# Patient Record
Sex: Female | Born: 1960 | Race: White | Hispanic: No | Marital: Married | Smoking: Former smoker
Health system: Southern US, Community
[De-identification: ages and names within clinical notes are randomized; demographics above are authoritative.]

## PROBLEM LIST (undated history)

## (undated) DIAGNOSIS — N939 Abnormal uterine and vaginal bleeding, unspecified: Secondary | ICD-10-CM

## (undated) DIAGNOSIS — I10 Essential (primary) hypertension: Secondary | ICD-10-CM

## (undated) DIAGNOSIS — J45909 Unspecified asthma, uncomplicated: Secondary | ICD-10-CM

## (undated) DIAGNOSIS — K219 Gastro-esophageal reflux disease without esophagitis: Secondary | ICD-10-CM

## (undated) HISTORY — DX: Gastro-esophageal reflux disease without esophagitis: K21.9

## (undated) HISTORY — PX: ECTOPIC PREGNANCY SURGERY: SHX613

## (undated) HISTORY — DX: Abnormal uterine and vaginal bleeding, unspecified: N93.9

---

## 1993-04-09 HISTORY — PX: LEEP: SHX91

## 2004-06-15 ENCOUNTER — Ambulatory Visit: Payer: Self-pay | Admitting: Family Medicine

## 2004-06-19 ENCOUNTER — Ambulatory Visit: Payer: Self-pay | Admitting: General Surgery

## 2005-02-13 ENCOUNTER — Emergency Department: Payer: Self-pay | Admitting: Emergency Medicine

## 2005-02-16 ENCOUNTER — Ambulatory Visit: Payer: Self-pay | Admitting: Unknown Physician Specialty

## 2005-02-22 ENCOUNTER — Inpatient Hospital Stay (HOSPITAL_COMMUNITY): Admission: AD | Admit: 2005-02-22 | Discharge: 2005-02-22 | Payer: Self-pay | Admitting: Obstetrics and Gynecology

## 2005-08-01 ENCOUNTER — Ambulatory Visit: Payer: Self-pay | Admitting: Internal Medicine

## 2005-08-23 ENCOUNTER — Ambulatory Visit: Payer: Self-pay | Admitting: Unknown Physician Specialty

## 2006-07-12 ENCOUNTER — Other Ambulatory Visit: Payer: Self-pay

## 2006-07-12 ENCOUNTER — Emergency Department: Payer: Self-pay | Admitting: Emergency Medicine

## 2007-06-17 ENCOUNTER — Encounter: Admission: RE | Admit: 2007-06-17 | Discharge: 2007-06-17 | Payer: Self-pay | Admitting: *Deleted

## 2007-06-19 ENCOUNTER — Ambulatory Visit (HOSPITAL_COMMUNITY): Admission: RE | Admit: 2007-06-19 | Discharge: 2007-06-19 | Payer: Self-pay | Admitting: *Deleted

## 2007-06-27 ENCOUNTER — Encounter: Admission: RE | Admit: 2007-06-27 | Discharge: 2007-06-27 | Payer: Self-pay | Admitting: *Deleted

## 2007-09-16 ENCOUNTER — Ambulatory Visit: Payer: Self-pay | Admitting: Obstetrics and Gynecology

## 2008-09-16 ENCOUNTER — Ambulatory Visit: Payer: Self-pay | Admitting: Obstetrics and Gynecology

## 2008-11-30 ENCOUNTER — Ambulatory Visit: Payer: Self-pay | Admitting: Family Medicine

## 2009-03-11 ENCOUNTER — Emergency Department: Payer: Self-pay | Admitting: Emergency Medicine

## 2009-03-14 ENCOUNTER — Inpatient Hospital Stay: Payer: Self-pay | Admitting: Unknown Physician Specialty

## 2009-03-16 ENCOUNTER — Inpatient Hospital Stay: Payer: Self-pay | Admitting: Internal Medicine

## 2009-09-20 ENCOUNTER — Ambulatory Visit: Payer: Self-pay | Admitting: Obstetrics and Gynecology

## 2010-01-18 ENCOUNTER — Emergency Department: Payer: Self-pay | Admitting: Emergency Medicine

## 2010-08-22 NOTE — Cardiovascular Report (Signed)
NAMESTEPHINIE, Jessica Beasley               ACCOUNT NO.:  [id number]   MEDICAL RECORD NO.:  [id number]          PATIENT TYPE:  OIB   LOCATION:  NA                           FACILITY:  [facility]   PHYSICIAN:  Darlin Priestly, MD  DATE OF BIRTH:  August 04, 1960   DATE OF PROCEDURE:  DATE OF DISCHARGE:                            CARDIAC CATHETERIZATION   PROCEDURES:  1. Left heart catheterization.  2. Coronary angiography.  3. Left ventriculogram.  4. Abdominal aortogram.  5. Selective bilateral renal angiogram.   COMPLICATIONS:  None.   INDICATIONS:  [name] is a 50 year old female with a history of  recurrent chest pain, hypertension, history of negative stress echo in  [place].  She has continued to have ongoing symptoms.  She is now  brought for cardiac catheterization to rule out significant CAD as well  as a renal angiogram secondary to hypertension.   DESCRIPTION/IMPRESSION:  After informed consent, the patient was brought  to the cardiac cath lab.  Right groin was shaved, prepped and draped in  the usual sterile fashion.  ECG monitoring  was established. Using  modified Seldinger technique.  A 6-French arterial sheath was inserted  in the right femoral artery.  A 6-French diagnostic catheter was used to  perform diagnostic angiography.   The left main is a large vessel with no significant disease.   The LAD is a large vessel coursing back to 1 diagonal branch.  The LAD  has no significant disease.   First diagonal is a medium-size vessel which bifurcates in the mid  segment, no significant disease.   Left circumflex is a medium-size vessel coursing the AV groove and gives  rise to 2 obtuse marginal branches.  AV circumflex has no significant  disease.   First OM is a small vessel with no significant disease.   Second OM is a medium-size vessel which bifurcates in the midsegment and  no significant disease.   The right coronary artery is a large vessel which is dominant  which  gives rise to both PDA as well as the posterolateral branch.  There is  no significant disease in the RCA, PDA or posterolateral branch.   Left ventriculogram reveals well preserved EF of 60%.   Abdominal aortogram reveals 2 left renal arteries both of which are  widely patent.   The right renal arteries were widely patent.   HEMODYNAMIC SYSTEM:  Arterial pressure 132/85, LV systolic pressure  140/90, LVEDP of 15.   CONCLUSION:  1. No significant coronary artery disease.  2. Normal left ventricular systolic function.  3. No evidence of renal artery stenosis.  4. Systemic hypertension.      Darlin Priestly, MD  Electronically Signed     RHM/MEDQ  D:  06/19/2007  T:  06/20/2007  Job:  [id number]

## 2010-09-15 ENCOUNTER — Ambulatory Visit: Payer: Self-pay | Admitting: Orthopedic Surgery

## 2010-09-28 ENCOUNTER — Ambulatory Visit: Payer: Self-pay | Admitting: Pain Medicine

## 2010-10-29 ENCOUNTER — Emergency Department: Payer: Self-pay | Admitting: Emergency Medicine

## 2010-11-13 ENCOUNTER — Ambulatory Visit: Payer: Self-pay | Admitting: Neurology

## 2010-12-28 ENCOUNTER — Ambulatory Visit: Payer: Self-pay | Admitting: Internal Medicine

## 2011-07-12 ENCOUNTER — Ambulatory Visit: Payer: Self-pay | Admitting: Internal Medicine

## 2012-01-08 ENCOUNTER — Ambulatory Visit: Payer: Self-pay | Admitting: Internal Medicine

## 2012-03-20 ENCOUNTER — Ambulatory Visit: Payer: Self-pay | Admitting: Internal Medicine

## 2012-07-09 ENCOUNTER — Encounter: Payer: Self-pay | Admitting: *Deleted

## 2012-09-15 ENCOUNTER — Ambulatory Visit: Payer: Self-pay | Admitting: Orthopedic Surgery

## 2012-10-13 ENCOUNTER — Ambulatory Visit: Payer: Self-pay | Admitting: Unknown Physician Specialty

## 2013-06-08 ENCOUNTER — Ambulatory Visit: Payer: Self-pay | Admitting: Family

## 2014-06-04 ENCOUNTER — Ambulatory Visit: Payer: Self-pay | Admitting: Family Medicine

## 2014-06-08 ENCOUNTER — Ambulatory Visit
Admit: 2014-06-08 | Disposition: A | Discharge status: Home / Self Care | Payer: Self-pay | Attending: Family Medicine | Admitting: Family Medicine

## 2015-02-22 ENCOUNTER — Emergency Department
Admission: EM | Admit: 2015-02-22 | Discharge: 2015-02-22 | Disposition: A | Discharge status: Home / Self Care | Payer: BLUE CROSS/BLUE SHIELD | Attending: Emergency Medicine | Admitting: Emergency Medicine

## 2015-02-22 ENCOUNTER — Emergency Department: Payer: BLUE CROSS/BLUE SHIELD

## 2015-02-22 ENCOUNTER — Encounter: Payer: Self-pay | Admitting: *Deleted

## 2015-02-22 DIAGNOSIS — R12 Heartburn: Secondary | ICD-10-CM | POA: Diagnosis not present

## 2015-02-22 DIAGNOSIS — Z87891 Personal history of nicotine dependence: Secondary | ICD-10-CM | POA: Insufficient documentation

## 2015-02-22 DIAGNOSIS — R61 Generalized hyperhidrosis: Secondary | ICD-10-CM | POA: Diagnosis not present

## 2015-02-22 DIAGNOSIS — R079 Chest pain, unspecified: Secondary | ICD-10-CM

## 2015-02-22 HISTORY — DX: Unspecified asthma, uncomplicated: J45.909

## 2015-02-22 HISTORY — DX: Essential (primary) hypertension: I10

## 2015-02-22 LAB — BASIC METABOLIC PANEL
Anion gap: 9 (ref 5–15)
BUN: 24 mg/dL — AB (ref 6–20)
CO2: 28 mmol/L (ref 22–32)
CREATININE: 0.89 mg/dL (ref 0.44–1.00)
Calcium: 9.5 mg/dL (ref 8.9–10.3)
Chloride: 104 mmol/L (ref 101–111)
GFR calc Af Amer: >60 mL/min (ref >60)
Glucose, Bld: 110 mg/dL — ABNORMAL HIGH (ref 65–99)
Potassium: 3.9 mmol/L (ref 3.5–5.1)
SODIUM: 141 mmol/L (ref 135–145)

## 2015-02-22 LAB — TROPONIN I: Troponin I: <0.03 ng/mL (ref <0.031)

## 2015-02-22 LAB — CBC
HCT: 45.5 % (ref 35.0–47.0)
Hemoglobin: 14.7 g/dL (ref 12.0–16.0)
MCH: 26.9 pg (ref 26.0–34.0)
MCHC: 32.4 g/dL (ref 32.0–36.0)
MCV: 83.1 fL (ref 80.0–100.0)
PLATELETS: 273 10*3/uL (ref 150–440)
RBC: 5.47 MIL/uL — ABNORMAL HIGH (ref 3.80–5.20)
RDW: 13.2 % (ref 11.5–14.5)
WBC: 9.3 10*3/uL (ref 3.6–11.0)

## 2015-02-22 MED ORDER — SODIUM CHLORIDE 0.9 % IV BOLUS (SEPSIS)
1000.0000 mL | Freq: Once | INTRAVENOUS | Status: AC
  Administered 2015-02-22: 1000 mL via INTRAVENOUS

## 2015-02-22 MED ORDER — ONDANSETRON HCL 4 MG/2ML IJ SOLN
4.0000 mg | Freq: Once | INTRAMUSCULAR | Status: AC
  Administered 2015-02-22: 4 mg via INTRAVENOUS
  Filled 2015-02-22: qty 2

## 2015-02-22 MED ORDER — GI COCKTAIL ~~LOC~~
30.0000 mL | Freq: Once | ORAL | Status: AC
Start: 2015-02-22 — End: 2015-02-22
  Administered 2015-02-22: 30 mL via ORAL
  Filled 2015-02-22: qty 30

## 2015-02-22 MED ORDER — PANTOPRAZOLE SODIUM 40 MG PO TBEC: 40.0000 mg | DELAYED_RELEASE_TABLET | Freq: Every day | ORAL | Status: DC

## 2015-02-22 MED ORDER — IOHEXOL 350 MG/ML SOLN
75.0000 mL | Freq: Once | INTRAVENOUS | Status: AC | PRN
  Administered 2015-02-22: 75 mL via INTRAVENOUS

## 2015-02-22 NOTE — ED Notes (Signed)
Pt woke up with burning all in her chest area, felt like she needed to vomit. EMS called pt was on the floor diaphoresis.  Possible LOC while on toilet

## 2015-02-22 NOTE — ED Notes (Signed)
Pt states she is anxious but pain free s/p being informed by Dr. Manson PasseyBrown that there is an area on chest xray that needs evaluation by CT. Pts hr 140's. Dr Lenard Lancepaduchowski notified

## 2015-02-22 NOTE — ED Provider Notes (Signed)
-----------------------------------------   8:30 AM on 02/22/2015 -----------------------------------------  Patient care assumed from Dr. Manson PasseyBrown. CT scan negative. No acute findings in the chest. Patient's heart rate did increase to 140 this was immediately after being notified that she will have a CT scan. Patient admits she is very anxious. Denies shortness of breath. Labs have resulted showing overall normal results. We'll monitor for improvement of pulse rate, receiving 1 L IV fluids. Likely gastric reflux as the cause of her discomfort however I discussed with the patient the need to follow up with cardiology as soon as possible to discuss further testing and workup such as stress test. Patient is agreeable to plan. We'll discharge on Protonix.  Minna AntisKevin Ruel Dimmick, MD 02/22/15 [phone]

## 2015-02-22 NOTE — ED Provider Notes (Signed)
[facility] Emergency Department Provider Note  ____________________________________________  Time seen: 5:45 AM  I have reviewed the triage vital signs and the nursing notes.   HISTORY  Chief Complaint Chest Pain and Heartburn      HPI Jessica Beasley is a 54 y.o. female presents  with "burning" epigastric discomfort on awakening this morning. Patient admits to diaphoresis but no dyspnea. Patient denies any lower extremity pain or swelling. Patient admits to nausea however no active vomiting.     Past medical history none There are no active problems to display for this patient.   Past surgical history None  Current Outpatient Rx  Name  Route  Sig  Dispense  Refill  . Fexofenadine HCl (MUCINEX ALLERGY PO)   Oral   Take 1 tablet by mouth daily as needed.         Marland Kitchen PSEUDOEPHEDRINE HCL PO   Oral   Take 1 tablet by mouth daily as needed.           Allergies Aspirin  History reviewed. No pertinent family history.  Social History Social History  Substance Use Topics  . Smoking status: Former Games developer  . Smokeless tobacco: Never Used  . Alcohol Use: No    Review of Systems  Constitutional: Negative for fever. Eyes: Negative for visual changes. ENT: Negative for sore throat. Cardiovascular: Positive   for chest pain. Respiratory: Negative for shortness of breath. Gastrointestinal: Negative for abdominal pain, vomiting and diarrhea. Genitourinary: Negative for dysuria. Musculoskeletal: Negative for back pain. Skin: Negative for rash. Neurological: Negative for headaches, focal weakness or numbness.   10-point ROS otherwise negative.  ____________________________________________   PHYSICAL EXAM:  VITAL SIGNS: ED Triage Vitals  Enc Vitals Group     BP 02/22/15 0453 166/100 mmHg     Pulse Rate 02/22/15 0453 109     Resp 02/22/15 0453 18     Temp 02/22/15 0453 97.5 F (36.4 C)     Temp src --      SpO2 02/22/15 0453 98  %     Weight 02/22/15 0453 210 lb (95.255 kg)     Height 02/22/15 0453 5' 8.5" (1.74 m)     Head Cir --      Peak Flow --      Pain Score --      Pain Loc --      Pain Edu? --      Excl. in GC? --      Constitutional: Alert and oriented. Well appearing and in no distress. Eyes: Conjunctivae are normal. PERRL. Normal extraocular movements. ENT   Head: Normocephalic and atraumatic.   Nose: No congestion/rhinnorhea.   Mouth/Throat: Mucous membranes are moist.   Neck: No stridor. Hematological/Lymphatic/Immunilogical: No cervical lymphadenopathy. Cardiovascular: Normal rate, regular rhythm. Normal and symmetric distal pulses are present in all extremities. No murmurs, rubs, or gallops. Respiratory: Normal respiratory effort without tachypnea nor retractions. Breath sounds are clear and equal bilaterally. No wheezes/rales/rhonchi. Gastrointestinal: Soft and nontender. No distention. There is no CVA tenderness. Genitourinary: deferred Musculoskeletal: Nontender with normal range of motion in all extremities. No joint effusions.  No lower extremity tenderness nor edema. Neurologic:  Normal speech and language. No gross focal neurologic deficits are appreciated. Speech is normal.  Skin:  Skin is warm, dry and intact. No rash noted. Psychiatric: Mood and affect are normal. Speech and behavior are normal. Patient exhibits appropriate insight and judgment.  ____________________________________________    LABS (pertinent positives/negatives)  Labs Reviewed  BASIC METABOLIC PANEL - Abnormal; Notable for the following:    Glucose, Bld 110 (*)    BUN 24 (*)    All other components within normal limits  CBC - Abnormal; Notable for the following:    RBC 5.47 (*)    All other components within normal limits  TROPONIN I     ____________________________________________   EKG  ED ECG REPORT I, BROWN, Chebanse N, the attending physician, personally viewed and interpreted  this ECG.   Date: 02/22/2015  EKG Time: 4:53 AM  Rate: 107  Rhythm: Sinus tachycardia  Axis: None  Intervals: Normal  ST&T Change: None   ____________________________________________    RADIOLOGY  DG Chest 2 View (Final result) Result time: 02/22/15 06:04:50   Final result by Rad Results In Interface (02/22/15 06:04:50)   Narrative:   CLINICAL DATA: Chest pain for 2 hours.  EXAM: CHEST 2 VIEW  COMPARISON: Radiographs 03/20/2012, 07/12/2006. Chest CT 08/01/2005  FINDINGS: The heart is normal in size. There is opacity obscuring the right heart border, likely projecting anteriorly on the lateral view. Lungs elsewhere clear. Pulmonary vasculature is normal. No pleural effusion or pneumothorax. No acute osseous abnormalities.  IMPRESSION: Opacity obscuring the right heart border, may reflect epicardial fat pad, pericardial cyst, pulmonary opacity, or superimposed vascular structures. Recommend chest CT for further characterization.   Electronically Signed By: Rubye OaksMelanie Ehinger M.D. On: 02/22/2015 06:04        ____________________________________________   INITIAL IMPRESSION / ASSESSMENT AND PLAN / ED COURSE  Pertinent labs & imaging results that were available during my care of the patient were reviewed by me and considered in my medical decision making (see chart for details). I suspect the patient's pain most likely secondary to gastric esophageal reflux disease. I informed the patient of the request from the radiologist for a CT scan of the chest to evaluate for a opacity noted on the right heart border. Patient's care transferred to Dr. Lenard LancePaduchowski  ____________________________________________   FINAL CLINICAL IMPRESSION(S) / ED DIAGNOSES  Final diagnoses:  Chest pain, unspecified chest pain type      Darci Currentandolph N Brown, MD 02/25/15 [id number]

## 2015-02-22 NOTE — ED Notes (Signed)
Patient transported to CT 

## 2015-02-22 NOTE — Discharge Instructions (Signed)
As we have discussed please call the number provided for cardiology to arrange a stress test. These take your medications as prescribed. Return to the emergency department for any return/worsening chest pain, shortness of breath, or any other symptom personally concerning to yourself.   Nonspecific Chest Pain It is often hard to find the cause of chest pain. There is always a chance that your pain could be related to something serious, such as a heart attack or a blood clot in your lungs. Chest pain can also be caused by conditions that are not life-threatening. If you have chest pain, it is very important to follow up with your doctor.  HOME CARE  If you were prescribed an antibiotic medicine, finish it all even if you start to feel better.  Avoid any activities that cause chest pain.  Do not use any tobacco products, including cigarettes, chewing tobacco, or electronic cigarettes. If you need help quitting, ask your doctor.  Do not drink alcohol.  Take medicines only as told by your doctor.  Keep all follow-up visits as told by your doctor. This is important. This includes any further testing if your chest pain does not go away.  Your doctor may tell you to keep your head raised (elevated) while you sleep.  Make lifestyle changes as told by your doctor. These may include:  Getting regular exercise. Ask your doctor to suggest some activities that are safe for you.  Eating a heart-healthy diet. Your doctor or a diet specialist (dietitian) can help you to learn healthy eating options.  Maintaining a healthy weight.  Managing diabetes, if necessary.  Reducing stress. GET HELP IF:  Your chest pain does not go away, even after treatment.  You have a rash with blisters on your chest.  You have a fever. GET HELP RIGHT AWAY IF:  Your chest pain is worse.  You have an increasing cough, or you cough up blood.  You have severe belly (abdominal) pain.  You feel extremely  weak.  You pass out (faint).  You have chills.  You have sudden, unexplained chest discomfort.  You have sudden, unexplained discomfort in your arms, back, neck, or jaw.  You have shortness of breath at any time.  You suddenly start to sweat, or your skin gets clammy.  You feel nauseous.  You vomit.  You suddenly feel light-headed or dizzy.  Your heart begins to beat quickly, or it feels like it is skipping beats. These symptoms may be an emergency. Do not wait to see if the symptoms will go away. Get medical help right away. Call your local emergency services (911 in the [place] Do not drive yourself to the hospital.   This information is not intended to replace advice given to you by your health care provider. Make sure you discuss any questions you have with your health care provider.   Document Released: 09/12/2007 Document Revised: 04/16/2014 Document Reviewed: 10/30/2013 Elsevier Interactive Patient Education [employer]2016 Elsevier Inc.

## 2015-06-02 ENCOUNTER — Encounter: Payer: Self-pay | Admitting: Obstetrics and Gynecology

## 2015-07-05 ENCOUNTER — Encounter: Payer: Self-pay | Admitting: Obstetrics and Gynecology

## 2015-07-05 ENCOUNTER — Ambulatory Visit (INDEPENDENT_AMBULATORY_CARE_PROVIDER_SITE_OTHER): Payer: BLUE CROSS/BLUE SHIELD | Admitting: Obstetrics and Gynecology

## 2015-07-05 VITALS — BP 129/80 | HR 91 | Ht 69.0 in | Wt 226.6 lb

## 2015-07-05 DIAGNOSIS — Z01419 Encounter for gynecological examination (general) (routine) without abnormal findings: Secondary | ICD-10-CM

## 2015-07-05 DIAGNOSIS — N952 Postmenopausal atrophic vaginitis: Secondary | ICD-10-CM | POA: Diagnosis not present

## 2015-07-05 DIAGNOSIS — Z803 Family history of malignant neoplasm of breast: Secondary | ICD-10-CM | POA: Diagnosis not present

## 2015-07-05 DIAGNOSIS — N941 Unspecified dyspareunia: Secondary | ICD-10-CM | POA: Diagnosis not present

## 2015-07-05 DIAGNOSIS — Z1231 Encounter for screening mammogram for malignant neoplasm of breast: Secondary | ICD-10-CM

## 2015-07-05 DIAGNOSIS — R638 Other symptoms and signs concerning food and fluid intake: Secondary | ICD-10-CM

## 2015-07-05 DIAGNOSIS — Z0000 Encounter for general adult medical examination without abnormal findings: Secondary | ICD-10-CM

## 2015-07-05 DIAGNOSIS — Z780 Asymptomatic menopausal state: Secondary | ICD-10-CM | POA: Diagnosis not present

## 2015-07-05 DIAGNOSIS — Z1211 Encounter for screening for malignant neoplasm of colon: Secondary | ICD-10-CM | POA: Diagnosis not present

## 2015-07-05 MED ORDER — ESTROGENS, CONJUGATED 0.625 MG/GM VA CREA: TOPICAL_CREAM | VAGINAL | Status: DC

## 2015-07-05 NOTE — Progress Notes (Signed)
Patient ID: Jessica Beasley, female   DOB: 05-03-1960, 55 y.o.   MRN: [id number] ANNUAL PREVENTATIVE CARE GYN  ENCOUNTER NOTE  Subjective:       Jessica Beasley is a 55 y.o. [id number] female here for a routine annual gynecologic exam.  Current complaints: 1.  Hot flashes and nite sweats     Painful IC-frequency has diminished over the past 2 years due to discomfort   Gynecologic History Menarche-17 History of irregular cycles No history of dysmenorrhea No history of STI or PID Menopause-2008 No history of HRT use No LMP recorded. Patient is postmenopausal. Contraception: post menopausal status History of cervical dysplasia; status post LEEP cone biopsy late 1990s; no abnormal Pap smears since Last mammogram: Normal History of ectopic 3; status post linear salpingostomy 2; status post methotrexate therapy 1   Obstetric History OB History  Gravida Para Term Preterm AB SAB TAB Ectopic Multiple Living  6 2 2  4  1 3  2     # Outcome Date GA Lbr Len/2nd Weight Sex Delivery Anes PTL Lv  6 Term 2000   8 lb 6.4 oz (3.81 kg) F CS-LVertical   Y  5 Term 1988   8 lb 1.9 oz (3.683 kg) M CS-LVertical   Y  4 TAB 1986          3 Ectopic           2 Ectopic           1 Ectopic               Past Medical History  Diagnosis Date  . Asthma   . Hypertension   . GERD (gastroesophageal reflux disease)   . Abnormal uterine bleeding     Past Surgical History  Procedure Laterality Date  . Leep  1995  . Cesarean section      x2  . Ectopic pregnancy surgery  x3    No current outpatient prescriptions on file prior to visit.   No current facility-administered medications on file prior to visit.    Allergies  Allergen Reactions  . Aspirin Itching  . Ibuprofen Other (See Comments)  . Latex Other (See Comments)    Social History   Social History  . Marital Status: Married    Spouse Name: N/A  . Number of Children: N/A  . Years of Education: N/A   Occupational History  . Not on  file.   Social History Main Topics  . Smoking status: Former Games developermoker  . Smokeless tobacco: Never Used  . Alcohol Use: No  . Drug Use: No  . Sexual Activity: Yes    Birth Control/ Protection: Post-menopausal   Other Topics Concern  . Not on file   Social History Narrative    Family History  Problem Relation Age of Onset  . Diabetes Mother   . Colon cancer Father   . Heart disease Father   . Breast cancer Sister 1845  . Ovarian cancer Neg Hx     The following portions of the patient's history were reviewed and updated as appropriate: allergies, current medications, past family history, past medical history, past social history, past surgical history and problem list.  Review of Systems ROS Review of Systems - General ROS: negative for - chills, fatigue, fever, hot flashes, night sweats, weight gain or weight loss Psychological ROS: negative for - anxiety, decreased libido, depression, mood swings, physical abuse or sexual abuse Ophthalmic ROS: negative for - blurry vision, eye pain or  loss of vision ENT ROS: negative for - headaches, hearing change, visual changes or vocal changes Allergy and Immunology ROS: negative for - hives, itchy/watery eyes or seasonal allergies Hematological and Lymphatic ROS: negative for - bleeding problems, bruising, swollen lymph nodes or weight loss Endocrine ROS: negative for - galactorrhea, hair pattern changes, hot flashes, malaise/lethargy, mood swings, palpitations, polydipsia/polyuria, skin changes, temperature intolerance or unexpected weight changes Breast ROS: negative for - new or changing breast lumps or nipple discharge Respiratory ROS: negative for - cough or shortness of breath Cardiovascular ROS: negative for - chest pain, irregular heartbeat, palpitations or shortness of breath Gastrointestinal ROS: no abdominal pain, change in bowel habits, or black or bloody stools Genito-Urinary ROS: no dysuria, trouble voiding, or  hematuria Musculoskeletal ROS: negative for - joint pain or joint stiffness Neurological ROS: negative for - bowel and bladder control changes Dermatological ROS: negative for rash and skin lesion changes   Objective:   BP 129/80 mmHg  Pulse 91  Ht  (1.753 m)  Wt 226 lb 9.6 oz (102.785 kg)  BMI 33.45 kg/m2 CONSTITUTIONAL: Well-developed, well-nourished female in no acute distress.  PSYCHIATRIC: Normal mood and affect. Normal behavior. Normal judgment and thought content. NEUROLGIC: Alert and oriented to person, place, and time. Normal muscle tone coordination. No cranial nerve deficit noted. HENT:  Normocephalic, atraumatic, External right and left ear normal. Oropharynx is clear and moist EYES: Conjunctivae and EOM are normal. Pupils are equal, round, and reactive to light. No scleral icterus.  NECK: Normal range of motion, supple, no masses.  Normal thyroid.  SKIN: Skin is warm and dry. No rash noted. Not diaphoretic. No erythema. No pallor. CARDIOVASCULAR: Normal heart rate noted, regular rhythm, no murmur. RESPIRATORY: Clear to auscultation bilaterally. Effort and breath sounds normal, no problems with respiration noted. BREASTS: Symmetric in size. No masses, skin changes, nipple drainage, or lymphadenopathy. ABDOMEN: Soft, normal bowel sounds, no distention noted.  No tenderness, rebound or guarding.  BLADDER: Normal PELVIC:  External Genitalia: Moderately atrophic with agglutination of labia minora to the labia majora; skin bridge just inferior to the clitoral hood; no perineal skin changes noted  BUS: Normal  Vagina: Marketed stenosis of introitus allowing single digit exam; moderate vaginal atrophy; no masses or tenderness  Cervix: Scarred with post LEEP changes  Uterus: Normal size, shape,consistency, mobile  Adnexa: Normal  RV: Normal external exam; normal sphincter tone; no rectal masses  Bladder: Nontender  MUSCULOSKELETAL: Normal range of motion. No tenderness.  No  cyanosis, clubbing, or edema.  2+ distal pulses. LYMPHATIC: No Axillary, Supraclavicular, or Inguinal Adenopathy.    Assessment:  1. Annual exam-age 55 2. Menopausal state, asymptomatic 3. Vaginal atrophy, symptomatic 4. Dyspareunia 5. Family history of breast cancer, sister 53. Increased body mass index   Plan:  Pap: Pap Co Test Mammogram: Ordered Stool Guaiac Testing:  Ordered Labs: thru pcp Routine preventative health maintenance measures emphasized: Exercise/Diet/Weight control, Tobacco Warnings and Alcohol/Substance use risks Vaginal lubricants discussed (Virgin Olive Oil, Astroglide, Jo H2O gel Vaginal dilator therapy discussed-[email or web address] Return to Clinic - 1 Year   Crystal [place], [place]  Herold Harms, MD  Note: This dictation was prepared with Dragon dictation along with smaller phrase technology. Any transcriptional errors that result from this process are unintentional.

## 2015-07-05 NOTE — Patient Instructions (Addendum)
1 Pap smear done 2. Mammogram ordered 3. Self breast exam encouraged 4. Diet and exercise with weight loss goal of 10 pounds per year 5. Screening labs through Dr. Elberta SpanielHedrick-primary care 6. Calcium with vitamin D supplementation encouraged twice a day 7. Estrace cream 1/2 g intravaginal twice a week prescribed 8. Suggest lubricants:  Virgin Olive Oil  Astroglide  JO H20 Lubricant 9. [email or web address] - website for vaginal dilators  10. Return in 1 year for annual 11. Return in 4 months for follow-up on vaginal atrophy

## 2015-07-05 NOTE — Progress Notes (Signed)
GYN ENCOUNTER NOTE  Subjective:       Jessica Beasley is a 55 y.o. [phone] female is here for gynecologic evaluation of the following issues:  1. Establishment of care.    55 year old married white female para 2042, menopausal, on no HRT therapy, presents for establishment of care.   Gynecologic History Menarche-17 History of irregular cycles No history of dysmenorrhea No history of STI or PID Menopause-2008 No history of HRT use No LMP recorded. Patient is postmenopausal. Contraception: post menopausal status History of cervical dysplasia; status post LEEP cone biopsy late 1990s; no abnormal Pap smears since Last mammogram: Normal History of ectopic 3; status post linear salpingostomy 2; status post methotrexate therapy 1  Obstetric History OB History  Gravida Para Term Preterm AB SAB TAB Ectopic Multiple Living  6 2 2  4  1 3  2     # Outcome Date GA Lbr Len/2nd Weight Sex Delivery Anes PTL Lv  6 Term 2000   8 lb 6.4 oz (3.81 kg) F CS-LVertical   Y  5 Term 1988   8 lb 1.9 oz (3.683 kg) M CS-LVertical   Y  4 TAB 1986          3 Ectopic           2 Ectopic           1 Ectopic               Past Medical History  Diagnosis Date  . Asthma   . Hypertension   . GERD (gastroesophageal reflux disease)   . Abnormal uterine bleeding     Past Surgical History  Procedure Laterality Date  . Leep  1995  . Cesarean section      x2  . Ectopic pregnancy surgery  x3    No current outpatient prescriptions on file prior to visit.   No current facility-administered medications on file prior to visit.    Allergies  Allergen Reactions  . Aspirin Itching  . Ibuprofen Other (See Comments)  . Latex Other (See Comments)    Social History   Social History  . Marital Status: Married    Spouse Name: N/A  . Number of Children: N/A  . Years of Education: N/A   Occupational History  . Not on file.   Social History Main Topics  . Smoking status: Former Games developermoker  .  Smokeless tobacco: Never Used  . Alcohol Use: No  . Drug Use: No  . Sexual Activity: Yes    Birth Control/ Protection: Post-menopausal   Other Topics Concern  . Not on file   Social History Narrative    Family History  Problem Relation Age of Onset  . Diabetes Mother   . Colon cancer Father   . Heart disease Father   . Breast cancer Sister [id number]  . Ovarian cancer Neg Hx     The following portions of the patient's history were reviewed and updated as appropriate: allergies, current medications, past family history, past medical history, past social history, past surgical history and problem list.  Review of Systems Review of Systems - General ROS: negative for - chills, fatigue, fever, hot flashes, malaise or night sweats Hematological and Lymphatic ROS: negative for - bleeding problems or swollen lymph nodes Gastrointestinal ROS: negative for - abdominal pain, blood in stools, change in bowel habits and nausea/vomiting Musculoskeletal ROS: negative for - joint pain, muscle pain or muscular weakness Genito-Urinary ROS: negative for - change in menstrual cycle, dysmenorrhea, ,  dysuria, genital discharge, genital ulcers, hematuria, incontinence, irregular/heavy menses, nocturia or pelvic pain. POSITIVE-dyspareunia  Objective:   BP 129/80 mmHg  Pulse 91  Ht  (1.753 m)  Wt 226 lb 9.6 oz (102.785 kg)  BMI 33.45 kg/m2 CONSTITUTIONAL: Well-developed, well-nourished female in no acute distress.  HENT:  Normocephalic, atraumatic.  NECK: Normal range of motion, supple, no masses.  Normal thyroid.  SKIN: Skin is warm and dry. No rash noted. Not diaphoretic. No erythema. No pallor. NEUROLGIC: Alert and oriented to person, place, and time. PSYCHIATRIC: Normal mood and affect. Normal behavior. Normal judgment and thought content. CARDIOVASCULAR:Regular rate and rhythm without murmur RESPIRATORY: Clear BREASTS: Bilaterally symmetric without dominant masses adenopathy or nipple  discharge ABDOMEN: Soft, non distended; Non tender.  No Organomegaly. PELVIC:  External Genitalia: Moderately atrophic with agglutination of labia minora to the labia majora; skin bridge just inferior to the clitoral hood; no perineal skin changes noted  BUS: Normal  Vagina: Marketed stenosis of introitus allowing single digit exam; moderate vaginal atrophy; no masses or tenderness  Cervix: Scarred with post LEEP changes  Uterus: Normal size, shape,consistency, mobile  Adnexa: Normal  RV: Normal external exam; normal sphincter tone; no rectal masses  Bladder: Nontender MUSCULOSKELETAL: Normal range of motion. No tenderness.  No cyanosis, clubbing, or edema.     Assessment:   1. Annual exam-age 55 2. Menopausal state, asymptomatic 3. Vaginal atrophy, symptomatic 4. Dyspareunia 5. Family history of breast cancer, sister 43. Increased body mass index   Plan:   1 Pap smear done 2. Mammogram ordered 3. Self breast exam encouraged 4. Diet and exercise with weight loss goal of 10 pounds per year 5. Screening labs through Dr. Elberta Spaniel care 6. Calcium with vitamin D supplementation encouraged 7. Premarin cream 1 g intravaginally twice a week 8. Vaginal Dilator therapy discussed-[email or web address] 9. Return in 4 months for follow-up on vaginal atrophy 10. Return in 1 year for annual exam  Herold Harms, MD  Note: This dictation was prepared with Dragon dictation along with smaller phrase technology. Any transcriptional errors that result from this process are unintentional.

## 2015-07-07 LAB — PAP IG AND HPV HIGH-RISK
HPV, HIGH-RISK: NEGATIVE
PAP Smear Comment: 0

## 2015-07-18 ENCOUNTER — Ambulatory Visit
Admission: RE | Admit: 2015-07-18 | Discharge: 2015-07-18 | Disposition: A | Discharge status: Home / Self Care | Payer: BLUE CROSS/BLUE SHIELD | Source: Ambulatory Visit | Attending: Obstetrics and Gynecology | Admitting: Obstetrics and Gynecology

## 2015-07-18 DIAGNOSIS — Z1231 Encounter for screening mammogram for malignant neoplasm of breast: Secondary | ICD-10-CM | POA: Diagnosis present

## 2015-11-03 ENCOUNTER — Ambulatory Visit: Payer: BLUE CROSS/BLUE SHIELD | Admitting: Obstetrics and Gynecology

## 2016-01-15 LAB — FECAL OCCULT BLOOD, IMMUNOCHEMICAL: FECAL OCCULT BLD: NEGATIVE

## 2016-07-10 ENCOUNTER — Encounter: Payer: BLUE CROSS/BLUE SHIELD | Admitting: Obstetrics and Gynecology

## 2016-07-16 NOTE — Progress Notes (Signed)
Patient ID: Jessica Beasley, female   DOB: September 21, 1960, 56 y.o.   MRN: [id number] ANNUAL PREVENTATIVE CARE GYN  ENCOUNTER NOTE  Subjective:       Jessica Beasley is a 56 y.o. [id number] female here for a routine annual gynecologic exam.  Current complaints: 1.  Hot flashes and nite sweats- occas  Patient has stopped using Premarin vaginal cream because she is not sexually active at this time. She does report some urinary urgency, frequency, and nocturia, does understand that estrogen cream may help the symptoms, but she declines use at this time. Bowel function is normal. No major interval health issues are identified.     Gynecologic History Menarche-17 History of irregular cycles No history of dysmenorrhea No history of STI or PID Menopause-2008 No history of HRT use No LMP recorded. Patient is postmenopausal. Contraception: post menopausal status History of cervical dysplasia; status post LEEP cone biopsy late 1990s; no abnormal Pap smears since Last mammogram: 07/19/2015 birad 1 History of ectopic 3; status post linear salpingostomy 2; status post methotrexate therapy 1   Obstetric History OB History  Gravida Para Term Preterm AB Living  SAB TAB Ectopic Multiple Live Births    # Outcome Date GA Lbr Len/2nd Weight Sex Delivery Anes PTL Lv  6 Term 2000   8 lb 6.4 oz (3.81 kg) F CS-LVertical   LIV  5 Term 1988   8 lb 1.9 oz (3.683 kg) M CS-LVertical   LIV  4 TAB 1986          3 Ectopic           2 Ectopic           1 Ectopic               Past Medical History:  Diagnosis Date  . Abnormal uterine bleeding   . Asthma   . GERD (gastroesophageal reflux disease)   . Hypertension     Past Surgical History:  Procedure Laterality Date  . CESAREAN SECTION     x2  . ECTOPIC PREGNANCY SURGERY  x3  . LEEP  1995    Current Outpatient Prescriptions on File Prior to Visit  Medication Sig Dispense Refill  . conjugated estrogens (PREMARIN) vaginal  cream 1 gram intravaginal BIW 60 g 1   No current facility-administered medications on file prior to visit.     Allergies  Allergen Reactions  . Aspirin Itching  . Ibuprofen Other (See Comments)  . Latex Other (See Comments)    Social History   Social History  . Marital status: Married    Spouse name: N/A  . Number of children: N/A  . Years of education: N/A   Occupational History  . Not on file.   Social History Main Topics  . Smoking status: Former Games developer  . Smokeless tobacco: Never Used  . Alcohol use No  . Drug use: No  . Sexual activity: Yes    Birth control/ protection: Post-menopausal   Other Topics Concern  . Not on file   Social History Narrative  . No narrative on file    Family History  Problem Relation Age of Onset  . Diabetes Mother   . Colon cancer Father   . Heart disease Father   . Breast cancer Sister 66  . Ovarian cancer Neg Hx     The following portions of the patient's history were reviewed  and updated as appropriate: allergies, current medications, past family history, past medical history, past social history, past surgical history and problem list.  Review of Systems Review of Systems  Constitutional:       Occasional hot flash  HENT: Negative.   Respiratory: Negative.   Cardiovascular: Negative.   Gastrointestinal: Negative.   Genitourinary: Positive for dysuria, frequency and urgency.  Musculoskeletal: Negative.   Skin: Negative.   Neurological: Negative.   Endo/Heme/Allergies: Negative.   Psychiatric/Behavioral: Negative.      Objective:   BP (!) 160/88   Pulse (!) 105   Ht  (1.753 m)   Wt 229 lb 1.6 oz (103.9 kg)   BMI 33.83 kg/m  CONSTITUTIONAL: Well-developed, well-nourished female in no acute distress.  PSYCHIATRIC: Normal mood and affect. Normal behavior. Normal judgment and thought content. NEUROLGIC: Alert and oriented to person, place, and time. Normal muscle tone coordination. No cranial nerve deficit  noted. HENT:  Normocephalic, atraumatic, External right and left ear normal. Oropharynx is clear and moist EYES: Conjunctivae and EOM are normal. Pupils are equal, round, and reactive to light. No scleral icterus.  NECK: Normal range of motion, supple, no masses.  Normal thyroid.  SKIN: Skin is warm and dry. No rash noted. Not diaphoretic. No erythema. No pallor. CARDIOVASCULAR: Normal heart rate noted, regular rhythm, no murmur. RESPIRATORY: Clear to auscultation bilaterally. Effort and breath sounds normal, no problems with respiration noted. BREASTS: Symmetric in size. No masses, skin changes, nipple drainage, or lymphadenopathy. ABDOMEN: Soft, normal bowel sounds, no distention noted.  No tenderness, rebound or guarding.  BLADDER: Normal PELVIC:  External Genitalia: Moderately atrophic with agglutination of labia minora to the labia majora; skin bridge just inferior to the clitoral hood; no perineal skin changes noted  BUS: Normal  Vagina: Marked stenosis of introitus allowing single digit exam; moderate vaginal atrophy; no masses or tenderness  Cervix: Scarred with post LEEP changes; difficult to visualize with the Peterson speculum  Uterus: Normal size, shape,consistency, mobile  Adnexa: Normal; nonpalpable and nontender  RV: Normal external exam; normal sphincter tone; no rectal masses  Bladder: Nontender  MUSCULOSKELETAL: Normal range of motion. No tenderness.  No cyanosis, clubbing, or edema.  2+ distal pulses. LYMPHATIC: No Axillary, Supraclavicular, or Inguinal Adenopathy.    Assessment:  1. Annual exam-age 56 2. Menopausal state, asymptomatic 3. Vaginal atrophy, Asymptomatic 4. Urinary frequency urgency and nocturia 5. Family history of breast cancer, sister 6. Increased body mass index   Plan:  Pap: Due 2020 Mammogram: Ordered Stool Guaiac Testing:  Ordered Labs: fbs a1c lipid vit d tsh Routine preventative health maintenance measures emphasized:  Exercise/Diet/Weight control, Tobacco Warnings and Alcohol/Substance use risks  Urine culture is sent to rule out UTI Return to Clinic - 1 Year   Sina Sumpter Chaires, CMA  Herold Harms, MD   Note: This dictation was prepared with Dragon dictation along with smaller phrase technology. Any transcriptional errors that result from this process are unintentional.

## 2016-07-18 ENCOUNTER — Ambulatory Visit (INDEPENDENT_AMBULATORY_CARE_PROVIDER_SITE_OTHER): Payer: BLUE CROSS/BLUE SHIELD | Admitting: Obstetrics and Gynecology

## 2016-07-18 ENCOUNTER — Encounter: Payer: Self-pay | Admitting: Obstetrics and Gynecology

## 2016-07-18 VITALS — BP 160/88 | HR 105 | Ht 69.0 in | Wt 229.1 lb

## 2016-07-18 DIAGNOSIS — Z1211 Encounter for screening for malignant neoplasm of colon: Secondary | ICD-10-CM

## 2016-07-18 DIAGNOSIS — R638 Other symptoms and signs concerning food and fluid intake: Secondary | ICD-10-CM | POA: Diagnosis not present

## 2016-07-18 DIAGNOSIS — Z1231 Encounter for screening mammogram for malignant neoplasm of breast: Secondary | ICD-10-CM

## 2016-07-18 DIAGNOSIS — N941 Unspecified dyspareunia: Secondary | ICD-10-CM | POA: Diagnosis not present

## 2016-07-18 DIAGNOSIS — Z803 Family history of malignant neoplasm of breast: Secondary | ICD-10-CM | POA: Diagnosis not present

## 2016-07-18 DIAGNOSIS — Z780 Asymptomatic menopausal state: Secondary | ICD-10-CM

## 2016-07-18 DIAGNOSIS — Z01419 Encounter for gynecological examination (general) (routine) without abnormal findings: Secondary | ICD-10-CM | POA: Diagnosis not present

## 2016-07-18 DIAGNOSIS — N952 Postmenopausal atrophic vaginitis: Secondary | ICD-10-CM

## 2016-07-18 NOTE — Patient Instructions (Signed)
1. No Pap smear is needed until 2020 2. Mammogram is ordered 3. Stool guaiac cards are given for colon cancer screening 4. Screening labs are ordered 5. Recommend calcium with vitamin D supplementation twice a day 6. Patient is encouraged to continue with healthy eating, exercise, and controlled weight loss 7. Benefits of estrogen vaginal cream are discussed regarding urinary frequency and urgency symptoms. 8. Return in 1 year for annual exam   Health Maintenance for Postmenopausal Women Menopause is a normal process in which your reproductive ability comes to an end. This process happens gradually over a span of months to years, usually between the ages of 33 and 19. Menopause is complete when you have missed 12 consecutive menstrual periods. It is important to talk with your health care provider about some of the most common conditions that affect postmenopausal women, such as heart disease, cancer, and bone loss (osteoporosis). Adopting a healthy lifestyle and getting preventive care can help to promote your health and wellness. Those actions can also lower your chances of developing some of these common conditions. What should I know about menopause? During menopause, you may experience a number of symptoms, such as:  Moderate-to-severe hot flashes.  Night sweats.  Decrease in sex drive.  Mood swings.  Headaches.  Tiredness.  Irritability.  Memory problems.  Insomnia. Choosing to treat or not to treat menopausal changes is an individual decision that you make with your health care provider. What should I know about hormone replacement therapy and supplements? Hormone therapy products are effective for treating symptoms that are associated with menopause, such as hot flashes and night sweats. Hormone replacement carries certain risks, especially as you become older. If you are thinking about using estrogen or estrogen with progestin treatments, discuss the benefits and risks  with your health care provider. What should I know about heart disease and stroke? Heart disease, heart attack, and stroke become more likely as you age. This may be due, in part, to the hormonal changes that your body experiences during menopause. These can affect how your body processes dietary fats, triglycerides, and cholesterol. Heart attack and stroke are both medical emergencies. There are many things that you can do to help prevent heart disease and stroke:  Have your blood pressure checked at least every 1-2 years. High blood pressure causes heart disease and increases the risk of stroke.  If you are 47-62 years old, ask your health care provider if you should take aspirin to prevent a heart attack or a stroke.  Do not use any tobacco products, including cigarettes, chewing tobacco, or electronic cigarettes. If you need help quitting, ask your health care provider.  It is important to eat a healthy diet and maintain a healthy weight.  Be sure to include plenty of vegetables, fruits, low-fat dairy products, and lean protein.  Avoid eating foods that are high in solid fats, added sugars, or salt (sodium).  Get regular exercise. This is one of the most important things that you can do for your health.  Try to exercise for at least 150 minutes each week. The type of exercise that you do should increase your heart rate and make you sweat. This is known as moderate-intensity exercise.  Try to do strengthening exercises at least twice each week. Do these in addition to the moderate-intensity exercise.  Know your numbers.Ask your health care provider to check your cholesterol and your blood glucose. Continue to have your blood tested as directed by your health care provider. What  should I know about cancer screening? There are several types of cancer. Take the following steps to reduce your risk and to catch any cancer development as early as possible. Breast Cancer  Practice breast  self-awareness.  This means understanding how your breasts normally appear and feel.  It also means doing regular breast self-exams. Let your health care provider know about any changes, no matter how small.  If you are 16 or older, have a clinician do a breast exam (clinical breast exam or CBE) every year. Depending on your age, family history, and medical history, it may be recommended that you also have a yearly breast X-ray (mammogram).  If you have a family history of breast cancer, talk with your health care provider about genetic screening.  If you are at high risk for breast cancer, talk with your health care provider about having an MRI and a mammogram every year.  Breast cancer (BRCA) gene test is recommended for women who have family members with BRCA-related cancers. Results of the assessment will determine the need for genetic counseling and BRCA1 and for BRCA2 testing. BRCA-related cancers include these types:  Breast. This occurs in males or females.  Ovarian.  Tubal. This may also be called fallopian tube cancer.  Cancer of the abdominal or pelvic lining (peritoneal cancer).  Prostate.  Pancreatic. Cervical, Uterine, and Ovarian Cancer  Your health care provider may recommend that you be screened regularly for cancer of the pelvic organs. These include your ovaries, uterus, and vagina. This screening involves a pelvic exam, which includes checking for microscopic changes to the surface of your cervix (Pap test).  For women ages 21-65, health care providers may recommend a pelvic exam and a Pap test every three years. For women ages 42-65, they may recommend the Pap test and pelvic exam, combined with testing for human papilloma virus (HPV), every five years. Some types of HPV increase your risk of cervical cancer. Testing for HPV may also be done on women of any age who have unclear Pap test results.  Other health care providers may not recommend any screening for  nonpregnant women who are considered low risk for pelvic cancer and have no symptoms. Ask your health care provider if a screening pelvic exam is right for you.  If you have had past treatment for cervical cancer or a condition that could lead to cancer, you need Pap tests and screening for cancer for at least 20 years after your treatment. If Pap tests have been discontinued for you, your risk factors (such as having a new sexual partner) need to be reassessed to determine if you should start having screenings again. Some women have medical problems that increase the chance of getting cervical cancer. In these cases, your health care provider may recommend that you have screening and Pap tests more often.  If you have a family history of uterine cancer or ovarian cancer, talk with your health care provider about genetic screening.  If you have vaginal bleeding after reaching menopause, tell your health care provider.  There are currently no reliable tests available to screen for ovarian cancer. Lung Cancer  Lung cancer screening is recommended for adults 70-64 years old who are at high risk for lung cancer because of a history of smoking. A yearly low-dose CT scan of the lungs is recommended if you:  Currently smoke.  Have a history of at least 30 pack-years of smoking and you currently smoke or have quit within the past 15  years. A pack-year is smoking an average of one pack of cigarettes per day for one year. Yearly screening should:  Continue until it has been 15 years since you quit.  Stop if you develop a health problem that would prevent you from having lung cancer treatment. Colorectal Cancer  This type of cancer can be detected and can often be prevented.  Routine colorectal cancer screening usually begins at age 66 and continues through age 67.  If you have risk factors for colon cancer, your health care provider may recommend that you be screened at an earlier age.  If you have  a family history of colorectal cancer, talk with your health care provider about genetic screening.  Your health care provider may also recommend using home test kits to check for hidden blood in your stool.  A small camera at the end of a tube can be used to examine your colon directly (sigmoidoscopy or colonoscopy). This is done to check for the earliest forms of colorectal cancer.  Direct examination of the colon should be repeated every 5-10 years until age 41. However, if early forms of precancerous polyps or small growths are found or if you have a family history or genetic risk for colorectal cancer, you may need to be screened more often. Skin Cancer  Check your skin from head to toe regularly.  Monitor any moles. Be sure to tell your health care provider:  About any new moles or changes in moles, especially if there is a change in a mole's shape or color.  If you have a mole that is larger than the size of a pencil eraser.  If any of your family members has a history of skin cancer, especially at a young age, talk with your health care provider about genetic screening.  Always use sunscreen. Apply sunscreen liberally and repeatedly throughout the day.  Whenever you are outside, protect yourself by wearing long sleeves, pants, a wide-brimmed hat, and sunglasses. What should I know about osteoporosis? Osteoporosis is a condition in which bone destruction happens more quickly than new bone creation. After menopause, you may be at an increased risk for osteoporosis. To help prevent osteoporosis or the bone fractures that can happen because of osteoporosis, the following is recommended:  If you are 69-4 years old, get at least 1,000 mg of calcium and at least 600 mg of vitamin D per day.  If you are older than age 48 but younger than age 32, get at least 1,200 mg of calcium and at least 600 mg of vitamin D per day.  If you are older than age 31, get at least 1,200 mg of calcium and  at least 800 mg of vitamin D per day. Smoking and excessive alcohol intake increase the risk of osteoporosis. Eat foods that are rich in calcium and vitamin D, and do weight-bearing exercises several times each week as directed by your health care provider. What should I know about how menopause affects my mental health? Depression may occur at any age, but it is more common as you become older. Common symptoms of depression include:  Low or sad mood.  Changes in sleep patterns.  Changes in appetite or eating patterns.  Feeling an overall lack of motivation or enjoyment of activities that you previously enjoyed.  Frequent crying spells. Talk with your health care provider if you think that you are experiencing depression. What should I know about immunizations? It is important that you get and maintain your immunizations.  These include:  Tetanus, diphtheria, and pertussis (Tdap) booster vaccine.  Influenza every year before the flu season begins.  Pneumonia vaccine.  Shingles vaccine. Your health care provider may also recommend other immunizations. This information is not intended to replace advice given to you by your health care provider. Make sure you discuss any questions you have with your health care provider. Document Released: 05/18/2005 Document Revised: 10/14/2015 Document Reviewed: 12/28/2014 Elsevier Interactive Patient Education  2017 [employer].

## 2016-07-20 ENCOUNTER — Other Ambulatory Visit: Payer: BLUE CROSS/BLUE SHIELD

## 2016-07-21 LAB — LIPID PANEL
CHOLESTEROL TOTAL: 254 mg/dL — AB (ref 100–199)
Chol/HDL Ratio: 4 ratio (ref 0.0–4.4)
HDL: 63 mg/dL (ref >39)
LDL Calculated: 171 mg/dL — ABNORMAL HIGH (ref 0–99)
Triglycerides: 98 mg/dL (ref 0–149)
VLDL CHOLESTEROL CAL: 20 mg/dL (ref 5–40)

## 2016-07-21 LAB — HEMOGLOBIN A1C
ESTIMATED AVERAGE GLUCOSE: 120 mg/dL
HEMOGLOBIN A1C: 5.8 % — AB (ref 4.8–5.6)

## 2016-07-21 LAB — TSH: TSH: 1.06 u[IU]/mL (ref 0.450–4.500)

## 2016-07-21 LAB — VITAMIN D 25 HYDROXY (VIT D DEFICIENCY, FRACTURES): VIT D 25 HYDROXY: 24.7 ng/mL — AB (ref 30.0–100.0)

## 2016-07-21 LAB — GLUCOSE, RANDOM: Glucose: 94 mg/dL (ref 65–99)

## 2016-08-10 ENCOUNTER — Ambulatory Visit
Admission: RE | Admit: 2016-08-10 | Discharge: 2016-08-10 | Disposition: A | Discharge status: Home / Self Care | Payer: BLUE CROSS/BLUE SHIELD | Source: Ambulatory Visit | Attending: Obstetrics and Gynecology | Admitting: Obstetrics and Gynecology

## 2016-08-10 DIAGNOSIS — Z1231 Encounter for screening mammogram for malignant neoplasm of breast: Secondary | ICD-10-CM | POA: Insufficient documentation

## 2016-08-31 IMAGING — CT CT CHEST W/ CM
1 series · 15 of 31 positions shown, 19 images · IV contrast (omnipaque)
Comparison: None ; correlation chest radiograph 02/22/2015

CLINICAL DATA: Abnormal chest radiograph with density at the RIGHT
cardiophrenic angle. Awoke with burning in chest area, fell like she
needed to vomit, had syncopal episode at 2922 hours, mid sternal
chest pain, diaphoretic, coughing for 2 weeks, personal history of
hypertension and asthma

EXAM:
CT CHEST WITH CONTRAST
TECHNIQUE: Multidetector CT imaging of the chest was performed during
intravenous contrast administration. Sagittal and coronal MPR images
reconstructed from axial data set.
CONTRAST:  75mL OMNIPAQUE IOHEXOL 350 MG/ML SOLN IV

[Series 2: routine chest with · axial · 0.70mm/px · z∈[-203,+102]mm · 15 of 67 slices shown, 19 images]
[im 3/67  mediastinal]
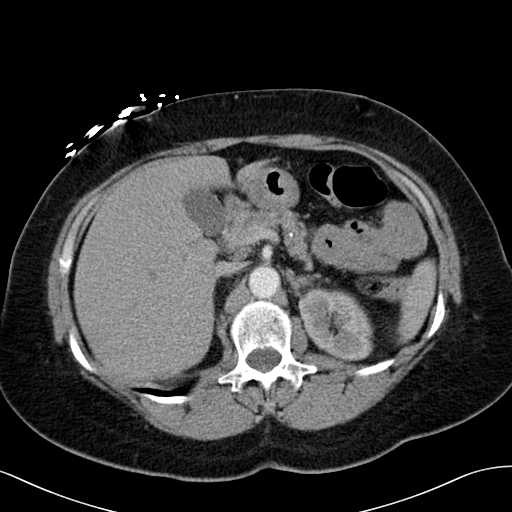
[im 3/67  lung]
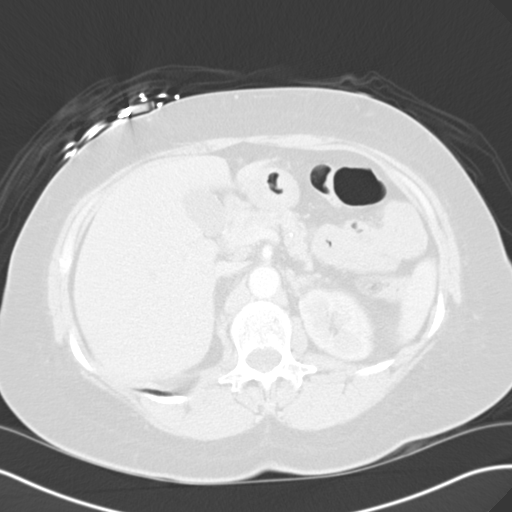
[im 8/67  lung]
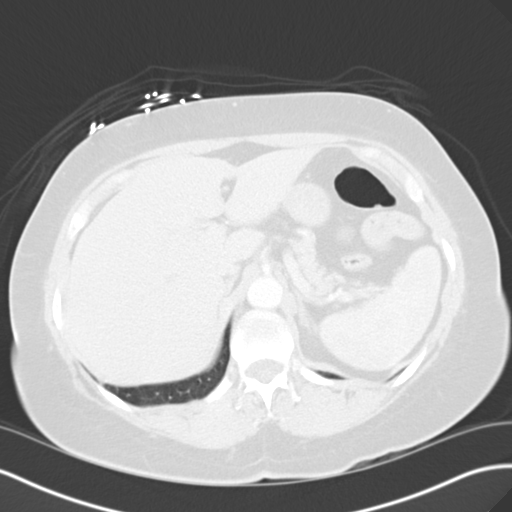
[im 13/67  lung]
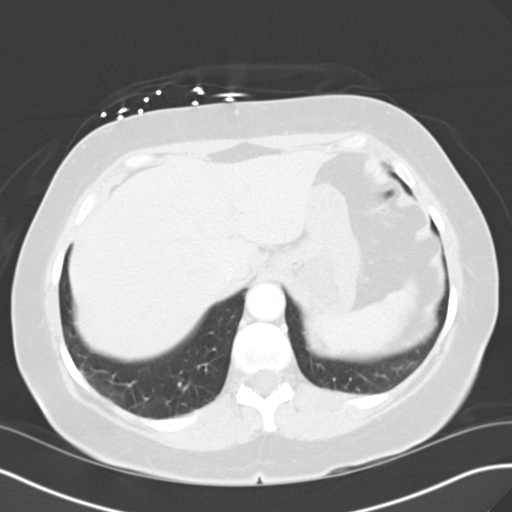
[im 15/67  lung]
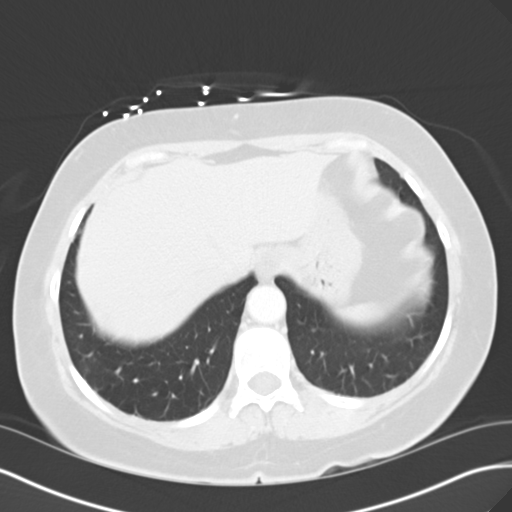
[im 20/67  mediastinal]
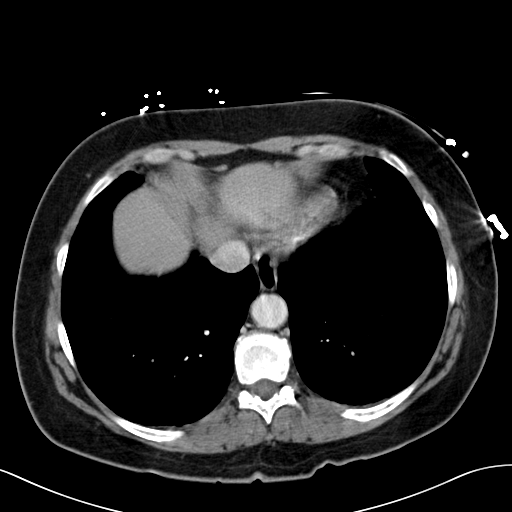
[im 20/67  lung]
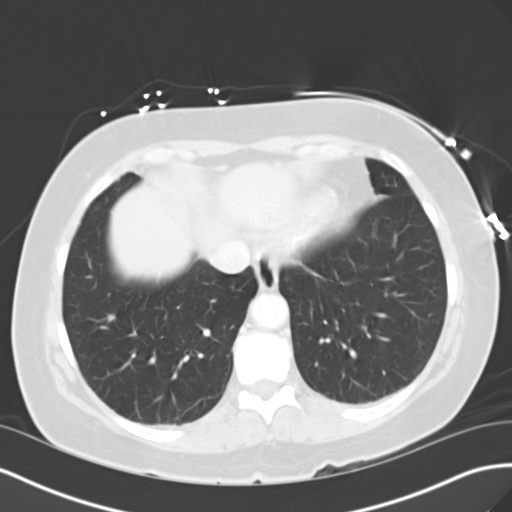
[im 25/67  lung]
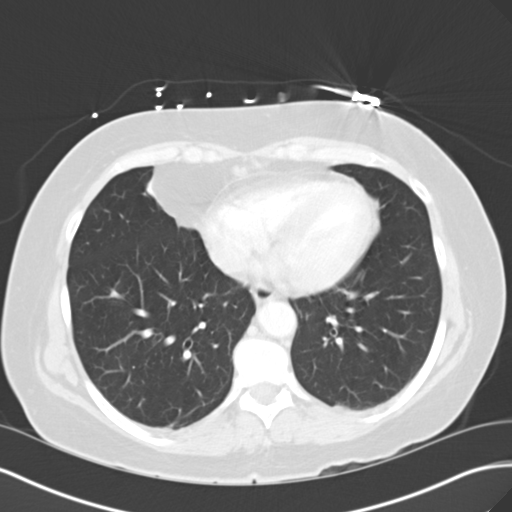
[im 30/67  lung]
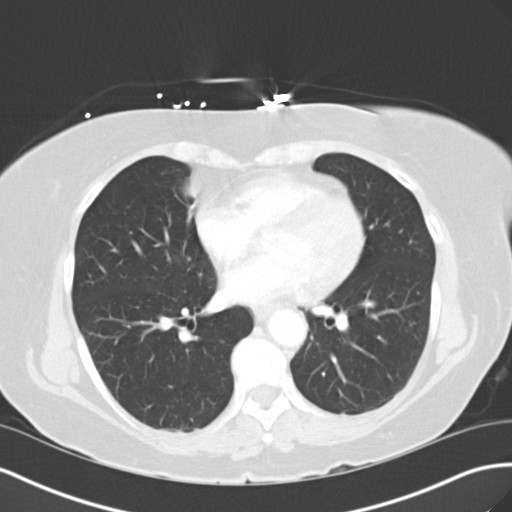
[im 35/67  lung]
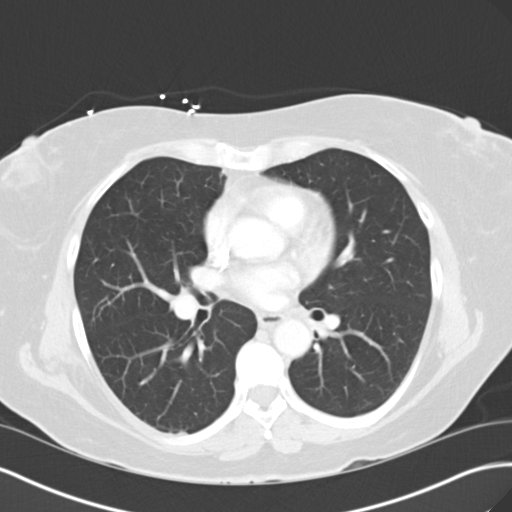
[im 37/67  mediastinal]
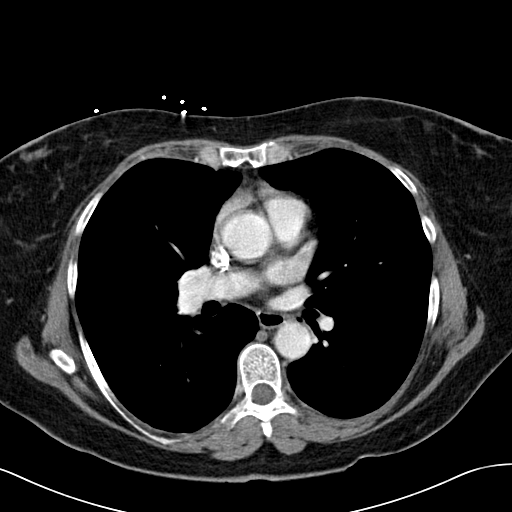
[im 37/67  lung]
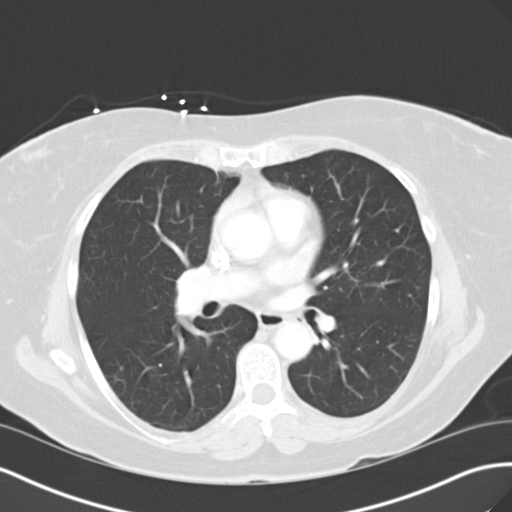
[im 42/67  lung]
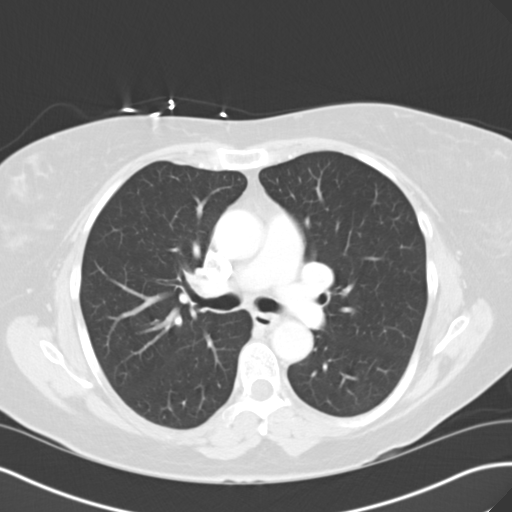
[im 47/67  lung]
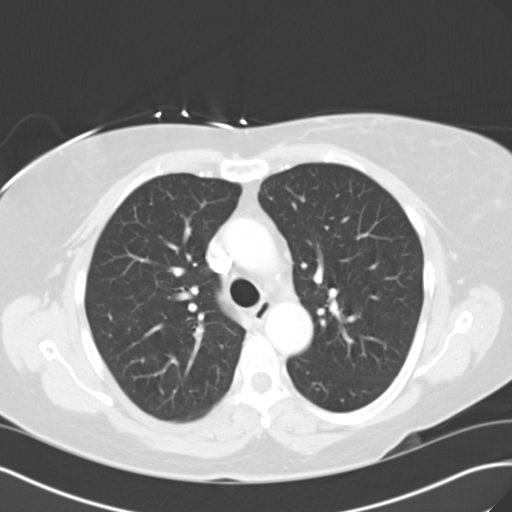
[im 52/67  lung]
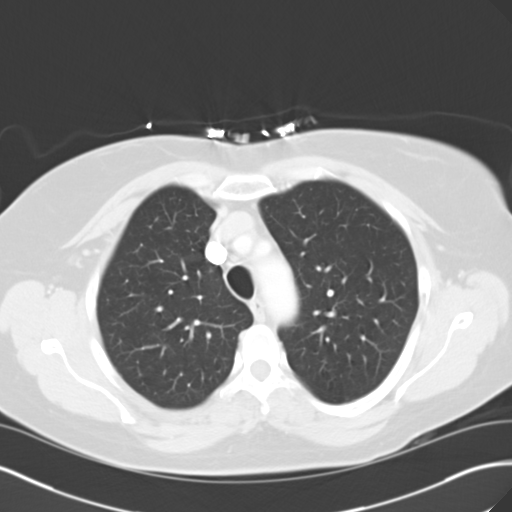
[im 54/67  mediastinal]
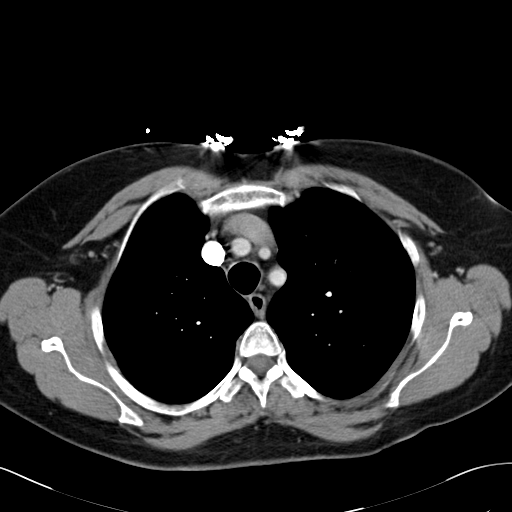
[im 54/67  lung]
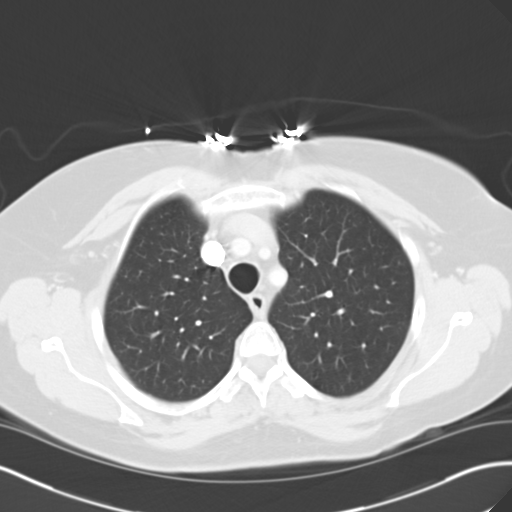
[im 59/67  lung]
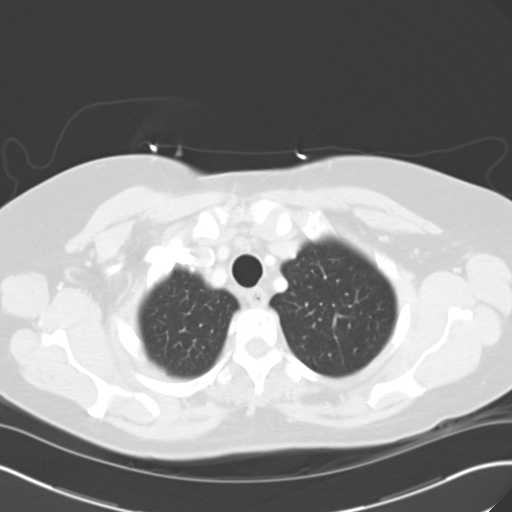
[im 64/67  lung]
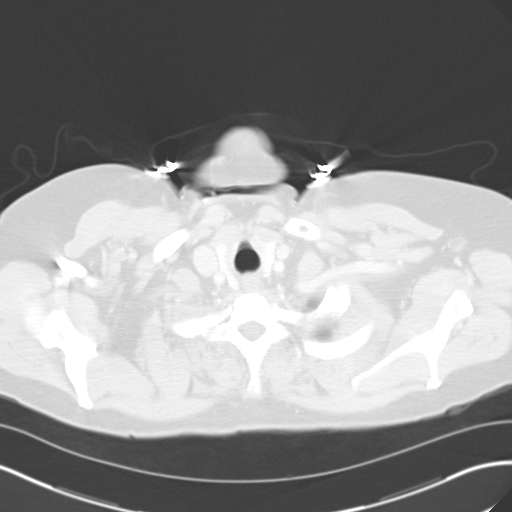

[15 of 31 positions shown; findings below may reference images not displayed]

FINDINGS: Vascular structures grossly patent on nondedicated exam.

Minimal atherosclerotic calcification aorta.

No thoracic adenopathy.

Prominent epicardial fat pad at RIGHT cardiophrenic angle,
accounting for radiographic finding.

Visualized upper abdomen normal appearance.

Lungs clear.

No pulmonary infiltrate, pleural effusion or pneumothorax.

No pulmonary mass or nodule.

Osseous structures unremarkable.
IMPRESSION: Prominent epicardial fat pad at RIGHT cardiophrenic angle accounting
for radiographic finding.

Otherwise negative CT chest.

## 2017-07-18 NOTE — Progress Notes (Deleted)
Patient ID: Jessica MurdochKelley A Swire, female   DOB: 1960-09-14, 57 y.o.   MRN: [id number] ANNUAL PREVENTATIVE CARE GYN  ENCOUNTER NOTE  Subjective:       Jessica Beasley is a 57 y.o. [id number] female here for a routine annual gynecologic exam.  Current complaints: 1.        Gynecologic History Menarche-17 History of irregular cycles No history of dysmenorrhea No history of STI or PID Menopause-2008 No history of HRT use No LMP recorded. Patient is postmenopausal. Contraception: post menopausal status History of cervical dysplasia; status post LEEP cone biopsy late 1990s; no abnormal Pap smears since- last pap - 06/2015 neg/neg Last mammogram: 08/10/2016 birad 1 History of ectopic 3; status post linear salpingostomy 2; status post methotrexate therapy 1   Obstetric History OB History  Gravida Para Term Preterm AB Living  6 2 2   4 2   SAB TAB Ectopic Multiple Live Births    1 3   2     # Outcome Date GA Lbr Len/2nd Weight Sex Delivery Anes PTL Lv  6 Term 2000   8 lb 6.4 oz (3.81 kg) F CS-LVertical   LIV  5 Term 1988   8 lb 1.9 oz (3.683 kg) M CS-LVertical   LIV  4 TAB 1986          3 Ectopic           2 Ectopic           1 Ectopic             Past Medical History:  Diagnosis Date  . Abnormal uterine bleeding   . Asthma   . GERD (gastroesophageal reflux disease)   . Hypertension     Past Surgical History:  Procedure Laterality Date  . CESAREAN SECTION     x2  . ECTOPIC PREGNANCY SURGERY  x3  . LEEP  1995    No current outpatient medications on file prior to visit.   No current facility-administered medications on file prior to visit.     Allergies  Allergen Reactions  . Aspirin Itching  . Ibuprofen Other (See Comments)  . Latex Other (See Comments)    Social History   Socioeconomic History  . Marital status: Married    Spouse name: Not on file  . Number of children: Not on file  . Years of education: Not on file  . Highest education level: Not on file   Occupational History  . Not on file  Social Needs  . Financial resource strain: Not on file  . Food insecurity:    Worry: Not on file    Inability: Not on file  . Transportation needs:    Medical: Not on file    Non-medical: Not on file  Tobacco Use  . Smoking status: Former Games developermoker  . Smokeless tobacco: Never Used  Substance and Sexual Activity  . Alcohol use: No  . Drug use: No  . Sexual activity: Not Currently    Birth control/protection: Post-menopausal  Lifestyle  . Physical activity:    Days per week: Not on file    Minutes per session: Not on file  . Stress: Not on file  Relationships  . Social connections:    Talks on phone: Not on file    Gets together: Not on file    Attends religious service: Not on file    Active member of club or organization: Not on file    Attends meetings of clubs or organizations: Not  on file    Relationship status: Not on file  . Intimate partner violence:    Fear of current or ex partner: Not on file    Emotionally abused: Not on file    Physically abused: Not on file    Forced sexual activity: Not on file  Other Topics Concern  . Not on file  Social History Narrative  . Not on file    Family History  Problem Relation Age of Onset  . Diabetes Mother   . Colon cancer Father   . Heart disease Father   . Breast cancer Sister 81  . Breast cancer Paternal Aunt   . Ovarian cancer Neg Hx     The following portions of the patient's history were reviewed and updated as appropriate: allergies, current medications, past family history, past medical history, past social history, past surgical history and problem list.  Review of Systems   Objective:   There were no vitals taken for this visit. CONSTITUTIONAL: Well-developed, well-nourished female in no acute distress.  PSYCHIATRIC: Normal mood and affect. Normal behavior. Normal judgment and thought content. NEUROLGIC: Alert and oriented to person, place, and time. Normal muscle  tone coordination. No cranial nerve deficit noted. HENT:  Normocephalic, atraumatic, External right and left ear normal. Oropharynx is clear and moist EYES: Conjunctivae and EOM are normal. Pupils are equal, round, and reactive to light. No scleral icterus.  NECK: Normal range of motion, supple, no masses.  Normal thyroid.  SKIN: Skin is warm and dry. No rash noted. Not diaphoretic. No erythema. No pallor. CARDIOVASCULAR: Normal heart rate noted, regular rhythm, no murmur. RESPIRATORY: Clear to auscultation bilaterally. Effort and breath sounds normal, no problems with respiration noted. BREASTS: Symmetric in size. No masses, skin changes, nipple drainage, or lymphadenopathy. ABDOMEN: Soft, normal bowel sounds, no distention noted.  No tenderness, rebound or guarding.  BLADDER: Normal PELVIC:  External Genitalia: Moderately atrophic with agglutination of labia minora to the labia majora; skin bridge just inferior to the clitoral hood; no perineal skin changes noted  BUS: Normal  Vagina: Marked stenosis of introitus allowing single digit exam; moderate vaginal atrophy; no masses or tenderness  Cervix: Scarred with post LEEP changes; difficult to visualize with the Peterson speculum  Uterus: Normal size, shape,consistency, mobile  Adnexa: Normal; nonpalpable and nontender  RV: Normal external exam; normal sphincter tone; no rectal masses  Bladder: Nontender  MUSCULOSKELETAL: Normal range of motion. No tenderness.  No cyanosis, clubbing, or edema.  2+ distal pulses. LYMPHATIC: No Axillary, Supraclavicular, or Inguinal Adenopathy.    Assessment:  1. Annual exam-age 57 2. Menopausal state, asymptomatic 3. Vaginal atrophy, Asymptomatic 4. Urinary frequency urgency and nocturia 5. Family history of breast cancer, sister 43. Increased body mass index   Plan:  Pap: Due 2020 Mammogram: Ordered Stool Guaiac Testing:  Ordered Labs: fbs a1c lipid vit d tsh Routine preventative health  maintenance measures emphasized: Exercise/Diet/Weight control, Tobacco Warnings and Alcohol/Substance use risks  Return to Clinic - 1 Year   Darol Destine, [place]    Note: This dictation was prepared with Dragon dictation along with smaller phrase technology. Any transcriptional errors that result from this process are unintentional.

## 2017-07-23 ENCOUNTER — Encounter: Payer: BLUE CROSS/BLUE SHIELD | Admitting: Obstetrics and Gynecology
